# Patient Record
Sex: Female | Born: 1970
Health system: Southern US, Community
[De-identification: ages and names within clinical notes are randomized; demographics above are authoritative.]

## PROBLEM LIST (undated history)

## (undated) DIAGNOSIS — D219 Benign neoplasm of connective and other soft tissue, unspecified: Secondary | ICD-10-CM

## (undated) DIAGNOSIS — T7840XA Allergy, unspecified, initial encounter: Secondary | ICD-10-CM

## (undated) DIAGNOSIS — M549 Dorsalgia, unspecified: Secondary | ICD-10-CM

## (undated) DIAGNOSIS — E669 Obesity, unspecified: Secondary | ICD-10-CM

## (undated) DIAGNOSIS — K219 Gastro-esophageal reflux disease without esophagitis: Secondary | ICD-10-CM

## (undated) HISTORY — PX: UPPER GI ENDOSCOPY: SHX6162

---

## 2001-07-26 ENCOUNTER — Emergency Department (HOSPITAL_COMMUNITY): Admission: EM | Admit: 2001-07-26 | Discharge: 2001-07-26 | Payer: Self-pay

## 2001-11-21 ENCOUNTER — Emergency Department (HOSPITAL_COMMUNITY): Admission: EM | Admit: 2001-11-21 | Discharge: 2001-11-21 | Payer: Self-pay | Admitting: Emergency Medicine

## 2003-02-11 ENCOUNTER — Emergency Department (HOSPITAL_COMMUNITY): Admission: EM | Admit: 2003-02-11 | Discharge: 2003-02-11 | Payer: Self-pay | Admitting: Emergency Medicine

## 2005-09-29 ENCOUNTER — Other Ambulatory Visit: Admission: RE | Admit: 2005-09-29 | Discharge: 2005-09-29 | Payer: Self-pay | Admitting: Family Medicine

## 2006-10-02 ENCOUNTER — Other Ambulatory Visit: Admission: RE | Admit: 2006-10-02 | Discharge: 2006-10-02 | Payer: Self-pay | Admitting: *Deleted

## 2007-11-07 ENCOUNTER — Ambulatory Visit: Payer: Self-pay | Admitting: Gastroenterology

## 2007-12-25 ENCOUNTER — Ambulatory Visit: Payer: Self-pay | Admitting: Obstetrics and Gynecology

## 2008-05-16 ENCOUNTER — Ambulatory Visit: Payer: Self-pay | Admitting: Gastroenterology

## 2009-04-27 ENCOUNTER — Ambulatory Visit: Payer: Self-pay | Admitting: Family Medicine

## 2009-05-12 ENCOUNTER — Ambulatory Visit: Payer: Self-pay | Admitting: Gastroenterology

## 2009-05-20 ENCOUNTER — Ambulatory Visit: Payer: Self-pay | Admitting: Gastroenterology

## 2009-06-07 ENCOUNTER — Ambulatory Visit: Payer: Self-pay | Admitting: Obstetrics and Gynecology

## 2010-07-21 ENCOUNTER — Ambulatory Visit (INDEPENDENT_AMBULATORY_CARE_PROVIDER_SITE_OTHER): Payer: BC Managed Care – PPO | Admitting: Psychology

## 2010-07-21 DIAGNOSIS — F331 Major depressive disorder, recurrent, moderate: Secondary | ICD-10-CM

## 2010-08-25 ENCOUNTER — Ambulatory Visit: Payer: BC Managed Care – PPO | Admitting: Psychology

## 2010-09-01 ENCOUNTER — Ambulatory Visit (INDEPENDENT_AMBULATORY_CARE_PROVIDER_SITE_OTHER): Payer: BC Managed Care – PPO | Admitting: Psychology

## 2010-09-01 DIAGNOSIS — F331 Major depressive disorder, recurrent, moderate: Secondary | ICD-10-CM

## 2010-09-29 ENCOUNTER — Ambulatory Visit: Payer: BC Managed Care – PPO | Admitting: Psychology

## 2010-10-05 ENCOUNTER — Ambulatory Visit (INDEPENDENT_AMBULATORY_CARE_PROVIDER_SITE_OTHER): Payer: BC Managed Care – PPO | Admitting: Psychology

## 2010-10-05 DIAGNOSIS — F4323 Adjustment disorder with mixed anxiety and depressed mood: Secondary | ICD-10-CM

## 2010-10-20 ENCOUNTER — Ambulatory Visit (INDEPENDENT_AMBULATORY_CARE_PROVIDER_SITE_OTHER): Payer: BC Managed Care – PPO | Admitting: Psychology

## 2010-10-20 DIAGNOSIS — F4323 Adjustment disorder with mixed anxiety and depressed mood: Secondary | ICD-10-CM

## 2010-11-02 ENCOUNTER — Ambulatory Visit: Payer: BC Managed Care – PPO | Admitting: Psychology

## 2011-02-02 ENCOUNTER — Ambulatory Visit (INDEPENDENT_AMBULATORY_CARE_PROVIDER_SITE_OTHER): Payer: BC Managed Care – PPO | Admitting: Psychology

## 2011-02-02 DIAGNOSIS — F4323 Adjustment disorder with mixed anxiety and depressed mood: Secondary | ICD-10-CM

## 2011-02-08 ENCOUNTER — Ambulatory Visit: Payer: BC Managed Care – PPO | Admitting: Psychology

## 2011-03-30 ENCOUNTER — Ambulatory Visit: Payer: BC Managed Care – PPO | Admitting: Psychology

## 2011-04-06 ENCOUNTER — Ambulatory Visit (INDEPENDENT_AMBULATORY_CARE_PROVIDER_SITE_OTHER): Payer: BC Managed Care – PPO | Admitting: Psychology

## 2011-04-06 DIAGNOSIS — F4323 Adjustment disorder with mixed anxiety and depressed mood: Secondary | ICD-10-CM

## 2011-04-20 ENCOUNTER — Ambulatory Visit (INDEPENDENT_AMBULATORY_CARE_PROVIDER_SITE_OTHER): Payer: 59 | Admitting: Psychology

## 2011-04-20 DIAGNOSIS — F4323 Adjustment disorder with mixed anxiety and depressed mood: Secondary | ICD-10-CM

## 2011-04-28 ENCOUNTER — Ambulatory Visit: Payer: Self-pay | Admitting: Obstetrics and Gynecology

## 2011-05-11 ENCOUNTER — Ambulatory Visit (INDEPENDENT_AMBULATORY_CARE_PROVIDER_SITE_OTHER): Payer: 59 | Admitting: Psychology

## 2011-05-11 DIAGNOSIS — F4323 Adjustment disorder with mixed anxiety and depressed mood: Secondary | ICD-10-CM

## 2011-06-01 ENCOUNTER — Ambulatory Visit (INDEPENDENT_AMBULATORY_CARE_PROVIDER_SITE_OTHER): Payer: 59 | Admitting: Psychology

## 2011-06-01 DIAGNOSIS — F4323 Adjustment disorder with mixed anxiety and depressed mood: Secondary | ICD-10-CM

## 2011-06-15 ENCOUNTER — Ambulatory Visit: Payer: 59 | Admitting: Psychology

## 2011-06-22 ENCOUNTER — Ambulatory Visit (INDEPENDENT_AMBULATORY_CARE_PROVIDER_SITE_OTHER): Payer: 59 | Admitting: Psychology

## 2011-06-22 DIAGNOSIS — F4323 Adjustment disorder with mixed anxiety and depressed mood: Secondary | ICD-10-CM

## 2011-07-19 ENCOUNTER — Ambulatory Visit (INDEPENDENT_AMBULATORY_CARE_PROVIDER_SITE_OTHER): Payer: 59 | Admitting: Psychology

## 2011-07-19 DIAGNOSIS — F4323 Adjustment disorder with mixed anxiety and depressed mood: Secondary | ICD-10-CM

## 2011-08-23 ENCOUNTER — Ambulatory Visit (INDEPENDENT_AMBULATORY_CARE_PROVIDER_SITE_OTHER): Payer: 59 | Admitting: Psychology

## 2011-08-23 DIAGNOSIS — F4323 Adjustment disorder with mixed anxiety and depressed mood: Secondary | ICD-10-CM

## 2011-09-13 ENCOUNTER — Ambulatory Visit (INDEPENDENT_AMBULATORY_CARE_PROVIDER_SITE_OTHER): Payer: 59 | Admitting: Psychology

## 2011-09-13 DIAGNOSIS — F4323 Adjustment disorder with mixed anxiety and depressed mood: Secondary | ICD-10-CM

## 2011-10-11 ENCOUNTER — Ambulatory Visit (INDEPENDENT_AMBULATORY_CARE_PROVIDER_SITE_OTHER): Payer: 59 | Admitting: Psychology

## 2011-10-11 DIAGNOSIS — F4323 Adjustment disorder with mixed anxiety and depressed mood: Secondary | ICD-10-CM

## 2011-11-02 ENCOUNTER — Ambulatory Visit: Payer: 59 | Admitting: Psychology

## 2012-01-10 ENCOUNTER — Ambulatory Visit: Payer: Self-pay | Admitting: Family Medicine

## 2012-07-18 ENCOUNTER — Ambulatory Visit: Payer: Self-pay | Admitting: Obstetrics and Gynecology

## 2012-08-15 ENCOUNTER — Ambulatory Visit: Payer: 59 | Admitting: Psychology

## 2013-04-09 ENCOUNTER — Ambulatory Visit: Payer: 59 | Admitting: Psychology

## 2013-04-10 ENCOUNTER — Ambulatory Visit: Payer: 59 | Admitting: Psychology

## 2014-05-13 ENCOUNTER — Ambulatory Visit: Payer: Self-pay | Admitting: Family Medicine

## 2015-05-20 ENCOUNTER — Other Ambulatory Visit: Payer: Self-pay | Admitting: Family Medicine

## 2015-05-20 DIAGNOSIS — Z1231 Encounter for screening mammogram for malignant neoplasm of breast: Secondary | ICD-10-CM

## 2015-05-28 ENCOUNTER — Ambulatory Visit: Payer: 59

## 2015-06-04 ENCOUNTER — Ambulatory Visit
Admission: RE | Admit: 2015-06-04 | Discharge: 2015-06-04 | Disposition: A | Discharge status: Home / Self Care | Payer: 59 | Source: Ambulatory Visit | Attending: Family Medicine | Admitting: Family Medicine

## 2015-06-04 DIAGNOSIS — Z1231 Encounter for screening mammogram for malignant neoplasm of breast: Secondary | ICD-10-CM | POA: Diagnosis not present

## 2015-07-13 ENCOUNTER — Encounter: Payer: Self-pay | Admitting: *Deleted

## 2015-07-14 ENCOUNTER — Ambulatory Visit: Payer: 59 | Admitting: Anesthesiology

## 2015-07-14 ENCOUNTER — Ambulatory Visit
Admission: RE | Admit: 2015-07-14 | Discharge: 2015-07-14 | Disposition: A | Discharge status: Home / Self Care | Payer: 59 | Source: Ambulatory Visit | Attending: Gastroenterology | Admitting: Gastroenterology

## 2015-07-14 ENCOUNTER — Encounter
Admission: RE | Disposition: A | Discharge status: Home / Self Care | Payer: Self-pay | Source: Ambulatory Visit | Attending: Gastroenterology

## 2015-07-14 DIAGNOSIS — Z79899 Other long term (current) drug therapy: Secondary | ICD-10-CM | POA: Insufficient documentation

## 2015-07-14 DIAGNOSIS — R197 Diarrhea, unspecified: Secondary | ICD-10-CM | POA: Diagnosis present

## 2015-07-14 DIAGNOSIS — K219 Gastro-esophageal reflux disease without esophagitis: Secondary | ICD-10-CM | POA: Diagnosis not present

## 2015-07-14 DIAGNOSIS — Z6838 Body mass index (BMI) 38.0-38.9, adult: Secondary | ICD-10-CM | POA: Insufficient documentation

## 2015-07-14 DIAGNOSIS — D125 Benign neoplasm of sigmoid colon: Secondary | ICD-10-CM | POA: Insufficient documentation

## 2015-07-14 HISTORY — DX: Obesity, unspecified: E66.9

## 2015-07-14 HISTORY — DX: Gastro-esophageal reflux disease without esophagitis: K21.9

## 2015-07-14 HISTORY — DX: Benign neoplasm of connective and other soft tissue, unspecified: D21.9

## 2015-07-14 HISTORY — DX: Dorsalgia, unspecified: M54.9

## 2015-07-14 HISTORY — DX: Allergy, unspecified, initial encounter: T78.40XA

## 2015-07-14 HISTORY — PX: COLONOSCOPY WITH PROPOFOL: SHX5780

## 2015-07-14 SURGERY — COLONOSCOPY WITH PROPOFOL
Anesthesia: General

## 2015-07-14 MED ORDER — SODIUM CHLORIDE 0.9 % IV SOLN
INTRAVENOUS | Status: DC
  Administered 2015-07-14: 1000 mL via INTRAVENOUS

## 2015-07-14 MED ORDER — SODIUM CHLORIDE 0.9 % IV SOLN: INTRAVENOUS | Status: DC

## 2015-07-14 MED ORDER — PROPOFOL 500 MG/50ML IV EMUL
INTRAVENOUS | Status: DC | PRN
  Administered 2015-07-14: 200 ug/kg/min via INTRAVENOUS

## 2015-07-14 MED ORDER — MIDAZOLAM HCL 5 MG/5ML IJ SOLN
INTRAMUSCULAR | Status: DC | PRN
  Administered 2015-07-14: 1 mg via INTRAVENOUS

## 2015-07-14 MED ORDER — FENTANYL CITRATE (PF) 100 MCG/2ML IJ SOLN
INTRAMUSCULAR | Status: DC | PRN
  Administered 2015-07-14: 50 ug via INTRAVENOUS

## 2015-07-14 MED ORDER — LIDOCAINE HCL (CARDIAC) 20 MG/ML IV SOLN
INTRAVENOUS | Status: DC | PRN
  Administered 2015-07-14: 30 mg via INTRAVENOUS

## 2015-07-14 MED ORDER — LABETALOL HCL 5 MG/ML IV SOLN
INTRAVENOUS | Status: DC | PRN
  Administered 2015-07-14 (×2): 10 mg via INTRAVENOUS

## 2015-07-14 MED ORDER — PROPOFOL 10 MG/ML IV BOLUS
INTRAVENOUS | Status: DC | PRN
  Administered 2015-07-14 (×2): 50 mg via INTRAVENOUS

## 2015-07-14 NOTE — Op Note (Signed)
[facility] Patient Name: Stephanie Howell Procedure Date: 07/14/2015 2:12 PM MRN: [id number] Account #: [id number] Date of Birth: [birth year] Admit Type: Outpatient Age: 45 Room: [facility] ENDO ROOM 3 Gender: Female Note Status: Finalized Procedure:            Colonoscopy Indications:          Clinically significant diarrhea of unexplained origin,                        Change in bowel habits Providers:            Lollie Sails, MD Referring MD:         Irven Easterly. Kary Kos, MD (Referring MD) Medicines:            Monitored Anesthesia Care Complications:        No immediate complications. Procedure:            Pre-Anesthesia Assessment:                       - ASA Grade Assessment: II - A patient with mild                        systemic disease.                       After obtaining informed consent, the colonoscope was                        passed under direct vision. Throughout the procedure,                        the patient's blood pressure, pulse, and oxygen                        saturations were monitored continuously. The                        Colonoscope was introduced through the anus and                        advanced to the the cecum, identified by appendiceal                        orifice and ileocecal valve. The colonoscopy was                        performed with moderate difficulty due to a tortuous                        colon. Successful completion of the procedure was aided                        by changing the patient to a supine position, changing                        the patient to a prone position and using manual                        pressure. The patient tolerated the procedure well. The  quality of the bowel preparation was good. Findings:      A 3 mm polyp was found in the proximal sigmoid colon. The polyp was       sessile. The polyp was removed with a cold biopsy forceps. Resection and    retrieval were complete.      Biopsies for histology were taken with a cold forceps from the right       colon and left colon for evaluation of microscopic colitis.      The sigmoid colon, descending colon and transverse colon were moderately       tortuous.      The digital rectal exam was normal. Impression:           - One 3 mm polyp in the proximal sigmoid colon, removed                        with a cold biopsy forceps. Resected and retrieved.                       - Tortuous colon.                       - Biopsies were taken with a cold forceps from the                        right colon and left colon for evaluation of                        microscopic colitis. Recommendation:       - Discharge patient to home.                       - Await pathology results.                       - Telephone GI clinic for pathology results in 1 week.                       - Return to GI clinic in 3 weeks. Procedure Code(s):    --- Professional ---                       [phone], Colonoscopy, flexible; with biopsy, single or                        multiple Diagnosis Code(s):    --- Professional ---                       D12.5, Benign neoplasm of sigmoid colon                       R19.7, Diarrhea, unspecified                       R19.4, Change in bowel habit                       Q43.8, Other specified congenital malformations of                        intestine CPT copyright 2016 American Medical Association. All rights reserved. The codes documented in this report are preliminary  and upon coder review may  be revised to meet current compliance requirements. Lollie Sails, MD 07/14/2015 3:04:28 PM This report has been signed electronically. Number of Addenda: 0 Note Initiated On: 07/14/2015 2:12 PM Scope Withdrawal Time: 0 hours 17 minutes 8 seconds  Total Procedure Duration: 0 hours 32 minutes 46 seconds       [facility]

## 2015-07-14 NOTE — Transfer of Care (Signed)
Immediate Anesthesia Transfer of Care Note  Patient: Stephanie Howell  Procedure(s) Performed: Procedure(s): COLONOSCOPY WITH PROPOFOL (N/A)  Patient Location: PACU and Endoscopy Unit  Anesthesia Type:General  Level of Consciousness: awake and oriented  Airway & Oxygen Therapy: Patient Spontanous Breathing and Patient connected to nasal cannula oxygen  Post-op Assessment: Report given to RN and Post -op Vital signs reviewed and stable  Post vital signs: Reviewed and stable  Last Vitals:  Filed Vitals:   07/14/15 1308 07/14/15 1500  BP: 152/103 134/91  Pulse: 79 83  Temp: 36.1 C 36 C  Resp: 19 16    Last Pain: There were no vitals filed for this visit.       Complications: No apparent anesthesia complications

## 2015-07-14 NOTE — H&P (Signed)
Outpatient short stay form Pre-procedure 07/14/2015 2:00 PM Lollie Sails MD  Primary Physician: Dr. Maryland Pink  Reason for visit:  Colonoscopy  History of present illness:  Patient is a 45 year old female presenting today for colonoscopy with a change of bowel habits/irregular bowel habits. She states that she has been having episodic events of uncontrollable diarrhea about every 1-2 months it may last for several days. He had a similar episode of these occurring about 2 years ago. She does have lower abdominal pain or discomfort with this. She has not seen any black or bloody stools. She does occasionally take Imodium.  She tolerated her prep well. She denies use of any aspirin or blood thinning agents. In further discussion with patient's this sounds much like irritable bowel syndrome. There are symptoms of lower abdominal bloating as well as incomplete bowel movements.     Current facility-administered medications:  .  0.9 %  sodium chloride infusion, , Intravenous, Continuous, Lollie Sails, MD, Last Rate: 20 mL/hr at 07/14/15 1330, 1,000 mL at 07/14/15 1330 .  0.9 %  sodium chloride infusion, , Intravenous, Continuous, Lollie Sails, MD  Prescriptions prior to admission  Medication Sig Dispense Refill Last Dose  . Biotin 1 MG CAPS Take 1 mg by mouth daily.     . Cholecalciferol 1000 units TBDP Take 1,000 Units by mouth daily.     . meloxicam (MOBIC) 15 MG tablet Take 15 mg by mouth daily.     . Multiple Vitamin (MULTIVITAMIN) tablet Take 1 tablet by mouth daily.     Donnetta Hail Estradiol (SPRINTEC 28 PO) Take by mouth.   07/14/2015 at 0800  . omeprazole-sodium bicarbonate (ZEGERID) 40-1100 MG capsule Take 1 capsule by mouth daily before breakfast.        No Known Allergies   Past Medical History  Diagnosis Date  . Allergic state   . Back pain   . Fibroids   . Obesity   . GERD (gastroesophageal reflux disease)     Review of systems:      Physical  Exam    Heart and lungs: Regular rate and rhythm without rub or gallop, lungs are bilaterally clear.    HEENT: Normocephalic atraumatic eyes are anicteric    Other:     Pertinant exam for procedure: Soft nontender nondistended bowel sounds positive normoactive. There is some lower abdominal discomfort to light palpation. She has no rebound.    Planned proceedures: Colonoscopy and indicated procedures. I have discussed the risks benefits and complications of procedures to include not limited to bleeding, infection, perforation and the risk of sedation and the patient wishes to proceed.    Lollie Sails, MD Gastroenterology 07/14/2015  2:00 PM

## 2015-07-14 NOTE — Anesthesia Preprocedure Evaluation (Signed)
Anesthesia Evaluation  Patient identified by MRN, date of birth, ID band Patient awake    Reviewed: Allergy & Precautions, H&P , NPO status , Patient's Chart, lab work & pertinent test results, reviewed documented beta blocker date and time   History of Anesthesia Complications Negative for: history of anesthetic complications  Airway Mallampati: III  TM Distance: >3 FB Neck ROM: full    Dental no notable dental hx. (+) Teeth Intact   Pulmonary neg pulmonary ROS,    Pulmonary exam normal breath sounds clear to auscultation       Cardiovascular Exercise Tolerance: Good negative cardio ROS Normal cardiovascular exam Rhythm:regular Rate:Normal     Neuro/Psych negative neurological ROS  negative psych ROS   GI/Hepatic Neg liver ROS, GERD  ,  Endo/Other  negative endocrine ROS  Renal/GU negative Renal ROS  negative genitourinary   Musculoskeletal   Abdominal   Peds  Hematology negative hematology ROS (+)   Anesthesia Other Findings Past Medical History:   Allergic state                                               Back pain                                                    Fibroids                                                     Obesity                                                      GERD (gastroesophageal reflux disease)                       Reproductive/Obstetrics negative OB ROS                             Anesthesia Physical Anesthesia Plan  ASA: II  Anesthesia Plan: General   Post-op Pain Management:    Induction:   Airway Management Planned:   Additional Equipment:   Intra-op Plan:   Post-operative Plan:   Informed Consent: I have reviewed the patients History and Physical, chart, labs and discussed the procedure including the risks, benefits and alternatives for the proposed anesthesia with the patient or authorized representative who has indicated his/her  understanding and acceptance.   Dental Advisory Given  Plan Discussed with: Anesthesiologist, CRNA and Surgeon  Anesthesia Plan Comments:         Anesthesia Quick Evaluation

## 2015-07-14 NOTE — Anesthesia Postprocedure Evaluation (Signed)
Anesthesia Post Note  Patient: Stephanie Howell  Procedure(s) Performed: Procedure(s) (LRB): COLONOSCOPY WITH PROPOFOL (N/A)  Patient location during evaluation: Endoscopy Anesthesia Type: General Level of consciousness: awake and alert Pain management: pain level controlled Vital Signs Assessment: post-procedure vital signs reviewed and stable Respiratory status: spontaneous breathing and respiratory function stable Cardiovascular status: stable Anesthetic complications: no    Last Vitals:  Filed Vitals:   07/14/15 1503 07/14/15 1510  BP: 134/91 137/89  Pulse: 80 76  Temp:    Resp: 20 15    Last Pain: There were no vitals filed for this visit.               Najmo Pardue K

## 2015-07-14 NOTE — OR Nursing (Signed)
12:20 -  Dr. Marcello Moores notified of increased diastolic pressure.  No new orders received.  Norlene Campbell, RN

## 2015-07-16 LAB — SURGICAL PATHOLOGY

## 2015-07-18 ENCOUNTER — Encounter: Payer: Self-pay | Admitting: Gastroenterology

## 2016-03-15 DIAGNOSIS — I10 Essential (primary) hypertension: Secondary | ICD-10-CM | POA: Diagnosis not present

## 2016-03-15 DIAGNOSIS — Z8249 Family history of ischemic heart disease and other diseases of the circulatory system: Secondary | ICD-10-CM | POA: Diagnosis not present

## 2016-04-29 ENCOUNTER — Other Ambulatory Visit: Payer: Self-pay | Admitting: Family Medicine

## 2016-04-29 DIAGNOSIS — Z1231 Encounter for screening mammogram for malignant neoplasm of breast: Secondary | ICD-10-CM

## 2016-05-10 DIAGNOSIS — N951 Menopausal and female climacteric states: Secondary | ICD-10-CM | POA: Diagnosis not present

## 2016-05-10 DIAGNOSIS — Z0000 Encounter for general adult medical examination without abnormal findings: Secondary | ICD-10-CM | POA: Diagnosis not present

## 2016-05-23 DIAGNOSIS — Z124 Encounter for screening for malignant neoplasm of cervix: Secondary | ICD-10-CM | POA: Diagnosis not present

## 2016-05-23 DIAGNOSIS — Z0000 Encounter for general adult medical examination without abnormal findings: Secondary | ICD-10-CM | POA: Diagnosis not present

## 2016-05-23 DIAGNOSIS — N951 Menopausal and female climacteric states: Secondary | ICD-10-CM | POA: Diagnosis not present

## 2016-06-24 ENCOUNTER — Ambulatory Visit: Payer: 59

## 2016-06-29 ENCOUNTER — Ambulatory Visit
Admission: RE | Admit: 2016-06-29 | Discharge: 2016-06-29 | Disposition: A | Discharge status: Home / Self Care | Payer: 59 | Source: Ambulatory Visit | Attending: Family Medicine | Admitting: Family Medicine

## 2016-06-29 DIAGNOSIS — Z1231 Encounter for screening mammogram for malignant neoplasm of breast: Secondary | ICD-10-CM

## 2016-08-29 DIAGNOSIS — R300 Dysuria: Secondary | ICD-10-CM | POA: Diagnosis not present

## 2017-01-17 DIAGNOSIS — R102 Pelvic and perineal pain: Secondary | ICD-10-CM | POA: Diagnosis not present

## 2017-01-17 DIAGNOSIS — N951 Menopausal and female climacteric states: Secondary | ICD-10-CM | POA: Diagnosis not present

## 2017-02-15 DIAGNOSIS — R102 Pelvic and perineal pain: Secondary | ICD-10-CM | POA: Diagnosis not present

## 2017-02-21 DIAGNOSIS — J069 Acute upper respiratory infection, unspecified: Secondary | ICD-10-CM | POA: Diagnosis not present

## 2017-02-21 DIAGNOSIS — Z23 Encounter for immunization: Secondary | ICD-10-CM | POA: Diagnosis not present

## 2017-03-06 DIAGNOSIS — J40 Bronchitis, not specified as acute or chronic: Secondary | ICD-10-CM | POA: Diagnosis not present

## 2017-03-06 DIAGNOSIS — R0981 Nasal congestion: Secondary | ICD-10-CM | POA: Diagnosis not present

## 2017-03-16 DIAGNOSIS — R102 Pelvic and perineal pain: Secondary | ICD-10-CM | POA: Diagnosis not present

## 2017-03-22 DIAGNOSIS — J069 Acute upper respiratory infection, unspecified: Secondary | ICD-10-CM | POA: Diagnosis not present

## 2017-04-21 DIAGNOSIS — R509 Fever, unspecified: Secondary | ICD-10-CM | POA: Diagnosis not present

## 2017-04-21 DIAGNOSIS — J029 Acute pharyngitis, unspecified: Secondary | ICD-10-CM | POA: Diagnosis not present

## 2017-04-21 DIAGNOSIS — J069 Acute upper respiratory infection, unspecified: Secondary | ICD-10-CM | POA: Diagnosis not present

## 2017-05-25 DIAGNOSIS — N3281 Overactive bladder: Secondary | ICD-10-CM | POA: Diagnosis not present

## 2017-05-25 DIAGNOSIS — Z01411 Encounter for gynecological examination (general) (routine) with abnormal findings: Secondary | ICD-10-CM | POA: Diagnosis not present

## 2017-05-25 DIAGNOSIS — Z124 Encounter for screening for malignant neoplasm of cervix: Secondary | ICD-10-CM | POA: Diagnosis not present

## 2017-05-31 DIAGNOSIS — Z1211 Encounter for screening for malignant neoplasm of colon: Secondary | ICD-10-CM | POA: Diagnosis not present

## 2017-06-02 ENCOUNTER — Other Ambulatory Visit: Payer: Self-pay | Admitting: Family Medicine

## 2017-06-02 DIAGNOSIS — Z1231 Encounter for screening mammogram for malignant neoplasm of breast: Secondary | ICD-10-CM

## 2017-07-25 ENCOUNTER — Ambulatory Visit
Admission: RE | Admit: 2017-07-25 | Discharge: 2017-07-25 | Disposition: A | Discharge status: Home / Self Care | Payer: 59 | Source: Ambulatory Visit | Attending: Family Medicine | Admitting: Family Medicine

## 2017-07-25 DIAGNOSIS — Z1231 Encounter for screening mammogram for malignant neoplasm of breast: Secondary | ICD-10-CM

## 2017-08-22 DIAGNOSIS — Z0000 Encounter for general adult medical examination without abnormal findings: Secondary | ICD-10-CM | POA: Diagnosis not present

## 2017-08-24 DIAGNOSIS — D72829 Elevated white blood cell count, unspecified: Secondary | ICD-10-CM | POA: Diagnosis not present

## 2017-08-24 DIAGNOSIS — Z0000 Encounter for general adult medical examination without abnormal findings: Secondary | ICD-10-CM | POA: Diagnosis not present

## 2017-08-24 DIAGNOSIS — J019 Acute sinusitis, unspecified: Secondary | ICD-10-CM | POA: Diagnosis not present

## 2017-09-19 DIAGNOSIS — K529 Noninfective gastroenteritis and colitis, unspecified: Secondary | ICD-10-CM | POA: Diagnosis not present

## 2017-09-19 DIAGNOSIS — R1084 Generalized abdominal pain: Secondary | ICD-10-CM | POA: Diagnosis not present

## 2017-09-19 DIAGNOSIS — R110 Nausea: Secondary | ICD-10-CM | POA: Diagnosis not present

## 2017-09-20 DIAGNOSIS — R110 Nausea: Secondary | ICD-10-CM | POA: Diagnosis not present

## 2017-09-20 DIAGNOSIS — Z789 Other specified health status: Secondary | ICD-10-CM | POA: Diagnosis not present

## 2017-09-20 DIAGNOSIS — K529 Noninfective gastroenteritis and colitis, unspecified: Secondary | ICD-10-CM | POA: Diagnosis not present

## 2017-09-20 DIAGNOSIS — D72829 Elevated white blood cell count, unspecified: Secondary | ICD-10-CM | POA: Diagnosis not present

## 2017-10-17 DIAGNOSIS — D72829 Elevated white blood cell count, unspecified: Secondary | ICD-10-CM | POA: Diagnosis not present

## 2017-10-17 DIAGNOSIS — Z111 Encounter for screening for respiratory tuberculosis: Secondary | ICD-10-CM | POA: Diagnosis not present

## 2017-12-13 DIAGNOSIS — K120 Recurrent oral aphthae: Secondary | ICD-10-CM | POA: Diagnosis not present

## 2017-12-13 DIAGNOSIS — L249 Irritant contact dermatitis, unspecified cause: Secondary | ICD-10-CM | POA: Diagnosis not present

## 2018-01-17 DIAGNOSIS — I10 Essential (primary) hypertension: Secondary | ICD-10-CM | POA: Diagnosis not present

## 2018-01-17 DIAGNOSIS — L819 Disorder of pigmentation, unspecified: Secondary | ICD-10-CM | POA: Diagnosis not present

## 2018-01-17 DIAGNOSIS — R0981 Nasal congestion: Secondary | ICD-10-CM | POA: Diagnosis not present

## 2018-02-16 DIAGNOSIS — K146 Glossodynia: Secondary | ICD-10-CM | POA: Diagnosis not present

## 2018-02-16 DIAGNOSIS — J069 Acute upper respiratory infection, unspecified: Secondary | ICD-10-CM | POA: Diagnosis not present

## 2018-03-16 DIAGNOSIS — Z711 Person with feared health complaint in whom no diagnosis is made: Secondary | ICD-10-CM | POA: Diagnosis not present

## 2018-03-16 DIAGNOSIS — L821 Other seborrheic keratosis: Secondary | ICD-10-CM | POA: Diagnosis not present

## 2018-03-30 DIAGNOSIS — N760 Acute vaginitis: Secondary | ICD-10-CM | POA: Diagnosis not present

## 2018-03-30 DIAGNOSIS — N898 Other specified noninflammatory disorders of vagina: Secondary | ICD-10-CM | POA: Diagnosis not present

## 2018-03-30 DIAGNOSIS — B9689 Other specified bacterial agents as the cause of diseases classified elsewhere: Secondary | ICD-10-CM | POA: Diagnosis not present

## 2018-05-12 DIAGNOSIS — K146 Glossodynia: Secondary | ICD-10-CM | POA: Diagnosis not present

## 2018-05-12 DIAGNOSIS — N898 Other specified noninflammatory disorders of vagina: Secondary | ICD-10-CM | POA: Diagnosis not present

## 2018-05-22 DIAGNOSIS — R1013 Epigastric pain: Secondary | ICD-10-CM | POA: Diagnosis not present

## 2018-05-22 DIAGNOSIS — R1011 Right upper quadrant pain: Secondary | ICD-10-CM | POA: Diagnosis not present

## 2018-06-19 DIAGNOSIS — K146 Glossodynia: Secondary | ICD-10-CM | POA: Diagnosis not present

## 2018-06-19 DIAGNOSIS — K529 Noninfective gastroenteritis and colitis, unspecified: Secondary | ICD-10-CM | POA: Diagnosis not present

## 2018-07-17 ENCOUNTER — Other Ambulatory Visit: Payer: Self-pay | Admitting: Family Medicine

## 2018-07-17 DIAGNOSIS — Z1231 Encounter for screening mammogram for malignant neoplasm of breast: Secondary | ICD-10-CM

## 2018-12-11 ENCOUNTER — Other Ambulatory Visit: Payer: Self-pay

## 2018-12-11 DIAGNOSIS — Z20822 Contact with and (suspected) exposure to covid-19: Secondary | ICD-10-CM

## 2018-12-13 LAB — NOVEL CORONAVIRUS, NAA: SARS-CoV-2, NAA: NOT DETECTED

## 2019-05-11 ENCOUNTER — Ambulatory Visit: Payer: Self-pay | Attending: Internal Medicine

## 2019-05-11 DIAGNOSIS — Z23 Encounter for immunization: Secondary | ICD-10-CM | POA: Insufficient documentation

## 2019-05-11 NOTE — Progress Notes (Signed)
   Covid-19 Vaccination Clinic  Name:  Stephanie Howell    MRN: [id number] DOB: March 22, 1970  05/11/2019  [name] was observed post Covid-19 immunization for 15 minutes without incident. She was provided with Vaccine Information Sheet and instruction to access the V-Safe system.   [name] was instructed to call 911 with any severe reactions post vaccine: Marland Kitchen Difficulty breathing  . Swelling of face and throat  . A fast heartbeat  . A bad rash all over body  . Dizziness and weakness   Immunizations Administered    Name Date Dose VIS Date Route   Pfizer COVID-19 Vaccine 05/11/2019  8:27 AM 0.3 mL 02/15/2019 Intramuscular   Manufacturer: [place]   Lot: [id number]   [place]: [id number]

## 2019-06-01 ENCOUNTER — Ambulatory Visit: Payer: Self-pay | Attending: Internal Medicine

## 2019-06-01 DIAGNOSIS — Z23 Encounter for immunization: Secondary | ICD-10-CM

## 2019-06-01 NOTE — Progress Notes (Signed)
   Covid-19 Vaccination Clinic  Name:  Stephanie Howell    MRN: [id number] DOB: 08-29-1970  06/01/2019  [name] was observed post Covid-19 immunization for 15 minutes without incident. She was provided with Vaccine Information Sheet and instruction to access the V-Safe system.   [name] was instructed to call 911 with any severe reactions post vaccine: Marland Kitchen Difficulty breathing  . Swelling of face and throat  . A fast heartbeat  . A bad rash all over body  . Dizziness and weakness   Immunizations Administered    Name Date Dose VIS Date Route   Pfizer COVID-19 Vaccine 06/01/2019  8:36 AM 0.3 mL 02/15/2019 Intramuscular   Manufacturer: [place]   Lot: [id number]   [place]: [id number]

## 2019-07-15 ENCOUNTER — Other Ambulatory Visit: Payer: Self-pay | Admitting: Family Medicine

## 2019-07-15 DIAGNOSIS — Z1231 Encounter for screening mammogram for malignant neoplasm of breast: Secondary | ICD-10-CM

## 2019-12-05 ENCOUNTER — Other Ambulatory Visit: Payer: Self-pay

## 2019-12-05 ENCOUNTER — Ambulatory Visit
Admission: RE | Admit: 2019-12-05 | Discharge: 2019-12-05 | Disposition: A | Discharge status: Home / Self Care | Payer: 59 | Source: Ambulatory Visit | Attending: Family Medicine | Admitting: Family Medicine

## 2019-12-05 DIAGNOSIS — Z1231 Encounter for screening mammogram for malignant neoplasm of breast: Secondary | ICD-10-CM | POA: Diagnosis present

## 2019-12-16 ENCOUNTER — Inpatient Hospital Stay
Admission: RE | Admit: 2019-12-16 | Discharge: 2019-12-16 | Disposition: A | Discharge status: Home / Self Care | Payer: Self-pay | Source: Ambulatory Visit | Attending: *Deleted | Admitting: *Deleted

## 2019-12-16 ENCOUNTER — Other Ambulatory Visit: Payer: Self-pay | Admitting: *Deleted

## 2019-12-16 DIAGNOSIS — Z1231 Encounter for screening mammogram for malignant neoplasm of breast: Secondary | ICD-10-CM

## 2020-05-26 ENCOUNTER — Other Ambulatory Visit: Payer: Self-pay | Admitting: Family Medicine

## 2020-05-26 DIAGNOSIS — Z1231 Encounter for screening mammogram for malignant neoplasm of breast: Secondary | ICD-10-CM

## 2020-12-07 ENCOUNTER — Other Ambulatory Visit: Payer: Self-pay

## 2020-12-07 ENCOUNTER — Ambulatory Visit
Admission: RE | Admit: 2020-12-07 | Discharge: 2020-12-07 | Disposition: A | Discharge status: Home / Self Care | Payer: Commercial Managed Care - PPO | Source: Ambulatory Visit | Attending: Family Medicine | Admitting: Family Medicine

## 2020-12-07 DIAGNOSIS — Z1231 Encounter for screening mammogram for malignant neoplasm of breast: Secondary | ICD-10-CM | POA: Insufficient documentation

## 2021-08-27 ENCOUNTER — Other Ambulatory Visit: Payer: Self-pay | Admitting: Family Medicine

## 2021-08-27 DIAGNOSIS — Z1231 Encounter for screening mammogram for malignant neoplasm of breast: Secondary | ICD-10-CM

## 2021-11-27 IMAGING — MG MM DIGITAL SCREENING BILAT W/ TOMO AND CAD
8 series · 8 of 24 positions shown · non-contrast
Comparison: Previous exam(s).

CLINICAL DATA: Screening.

EXAM:
DIGITAL SCREENING BILATERAL MAMMOGRAM WITH TOMOSYNTHESIS AND CAD
TECHNIQUE: Bilateral screening digital craniocaudal and mediolateral oblique
mammograms were obtained. Bilateral screening digital breast
tomosynthesis was performed. The images were evaluated with
computer-aided detection.

[R MLO synth-2D]
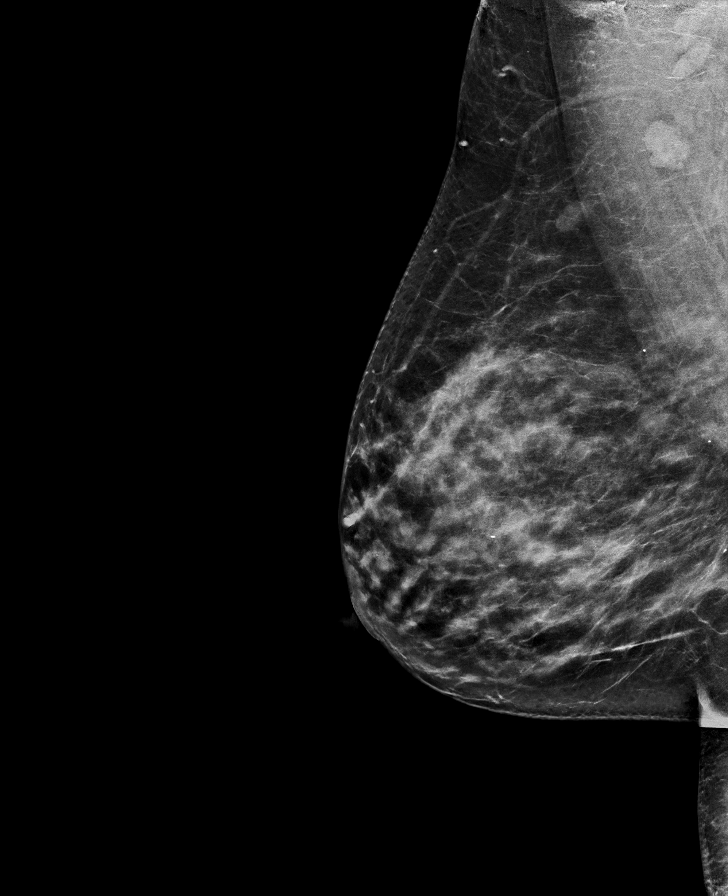

[L CC synth-2D]
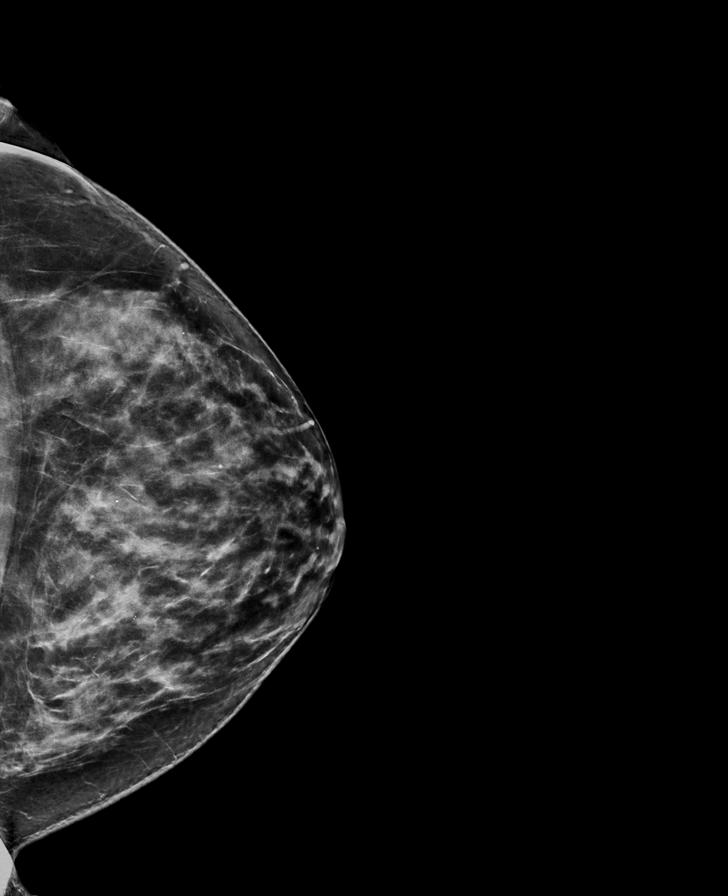

[R CC synth-2D]
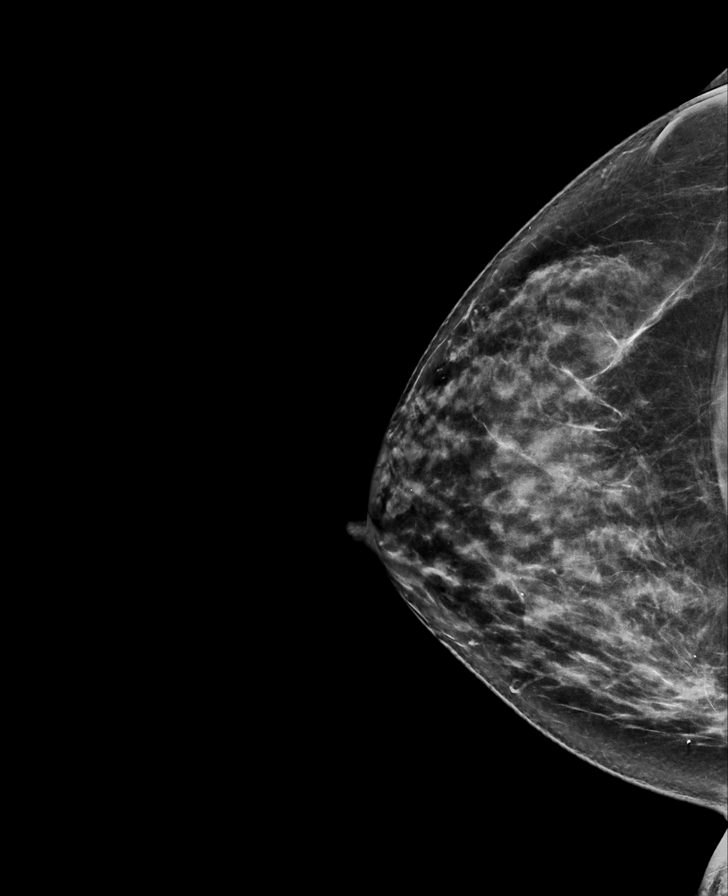

[L MLO synth-2D]
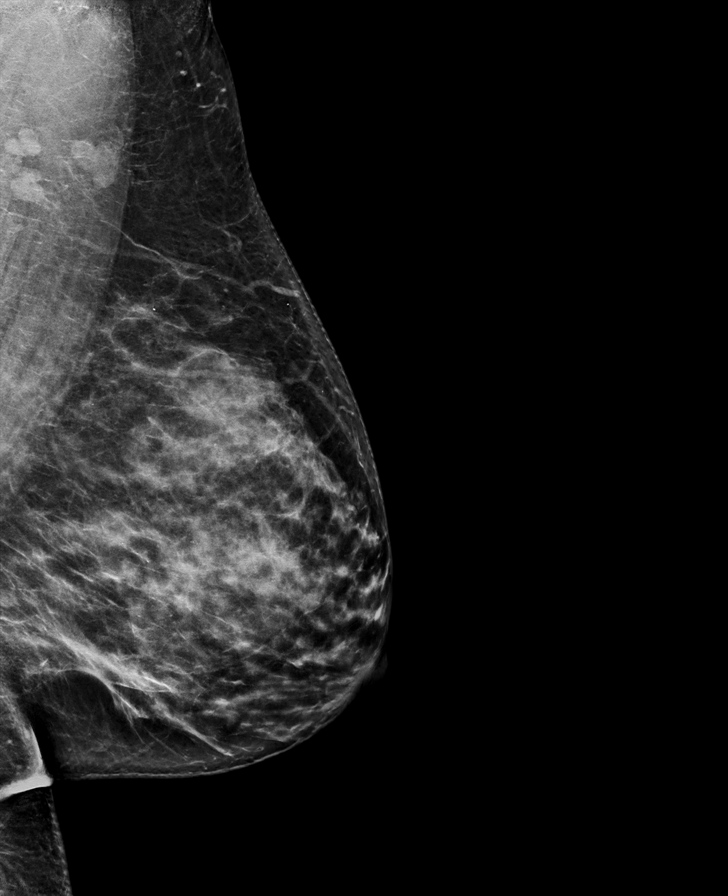

[L MLO tomo · tomo slice 38/75.0]
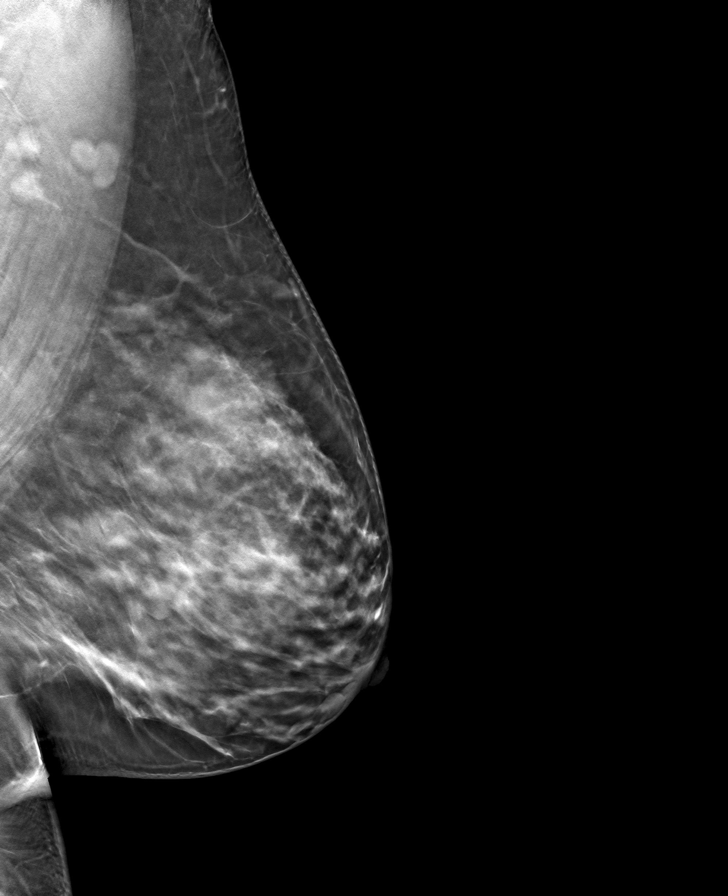

[L CC tomo · tomo slice 37/74.0]
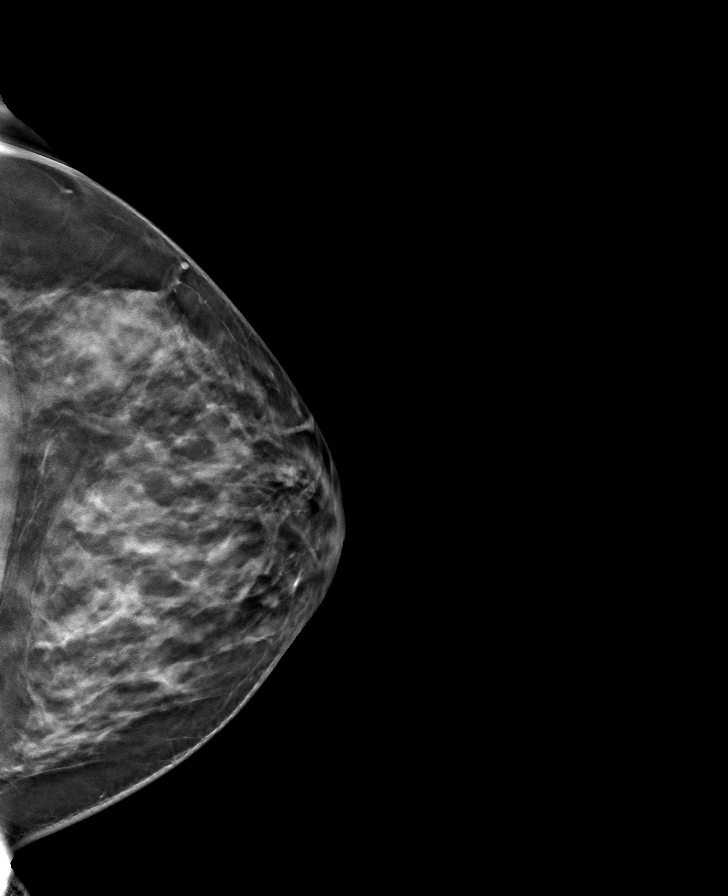

[R CC tomo · tomo slice 36/71.0]
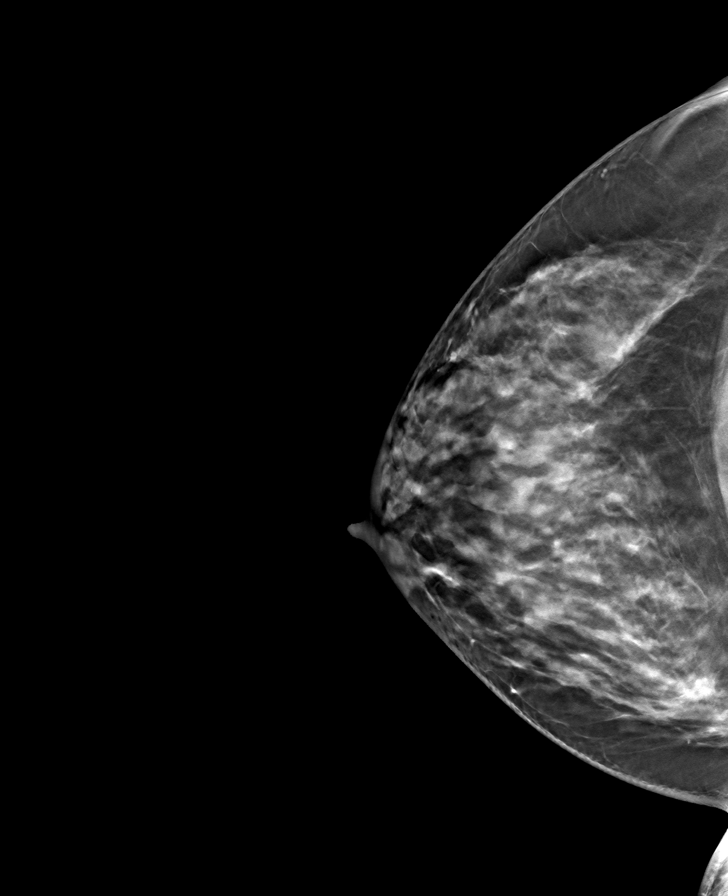

[R MLO tomo · tomo slice 38/75.0]
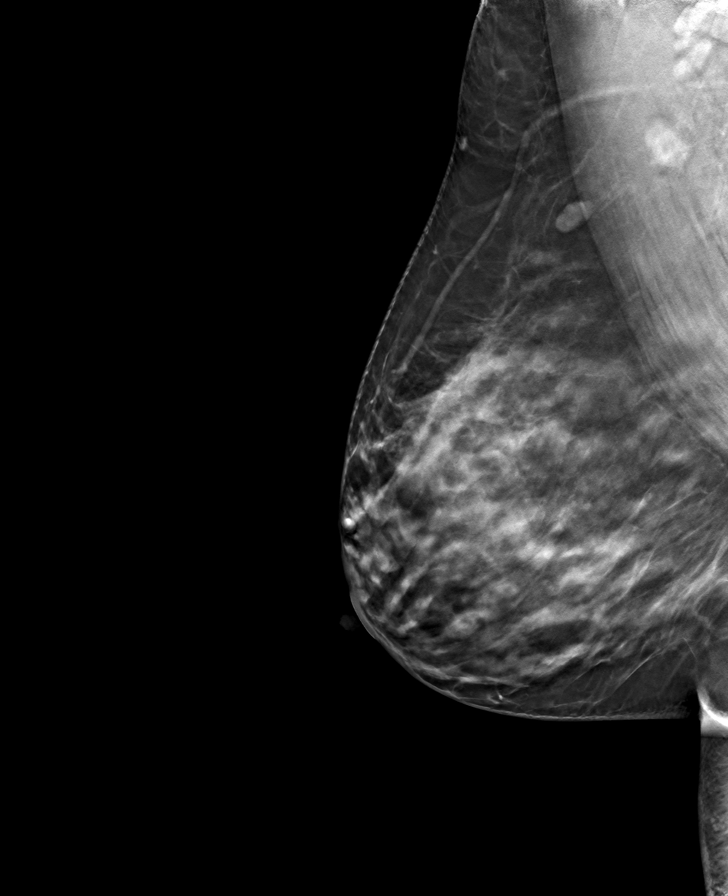

[8 of 24 positions shown; findings below may reference images not displayed]

ACR Breast Density Category c: The breast tissue is heterogeneously
dense, which may obscure small masses.
FINDINGS: There are no findings suspicious for malignancy.
IMPRESSION: No mammographic evidence of malignancy. A result letter of this
screening mammogram will be mailed directly to the patient.

RECOMMENDATION:
Screening mammogram in one year. (Code:[id number]-[id number])

BI-RADS CATEGORY  1: Negative.

## 2022-03-28 ENCOUNTER — Ambulatory Visit
Admission: RE | Admit: 2022-03-28 | Discharge: 2022-03-28 | Disposition: A | Discharge status: Home / Self Care | Payer: BC Managed Care – PPO | Source: Ambulatory Visit | Attending: Family Medicine | Admitting: Family Medicine

## 2022-03-28 DIAGNOSIS — Z1231 Encounter for screening mammogram for malignant neoplasm of breast: Secondary | ICD-10-CM | POA: Diagnosis not present

## 2022-03-31 ENCOUNTER — Other Ambulatory Visit: Payer: Self-pay | Admitting: Family Medicine

## 2022-03-31 DIAGNOSIS — R921 Mammographic calcification found on diagnostic imaging of breast: Secondary | ICD-10-CM

## 2022-03-31 DIAGNOSIS — R928 Other abnormal and inconclusive findings on diagnostic imaging of breast: Secondary | ICD-10-CM

## 2022-04-01 ENCOUNTER — Ambulatory Visit
Admission: RE | Admit: 2022-04-01 | Discharge: 2022-04-01 | Disposition: A | Discharge status: Home / Self Care | Payer: BC Managed Care – PPO | Source: Ambulatory Visit | Attending: Family Medicine | Admitting: Family Medicine

## 2022-04-01 DIAGNOSIS — R928 Other abnormal and inconclusive findings on diagnostic imaging of breast: Secondary | ICD-10-CM | POA: Diagnosis present

## 2022-04-01 DIAGNOSIS — R921 Mammographic calcification found on diagnostic imaging of breast: Secondary | ICD-10-CM | POA: Diagnosis present

## 2022-06-02 ENCOUNTER — Other Ambulatory Visit: Payer: Self-pay | Admitting: Family Medicine

## 2022-06-02 DIAGNOSIS — R921 Mammographic calcification found on diagnostic imaging of breast: Secondary | ICD-10-CM

## 2022-06-30 ENCOUNTER — Ambulatory Visit: Payer: Self-pay

## 2022-09-29 ENCOUNTER — Inpatient Hospital Stay: Payer: BC Managed Care – PPO

## 2022-09-29 ENCOUNTER — Inpatient Hospital Stay: Payer: BC Managed Care – PPO | Admitting: Internal Medicine

## 2022-09-29 VITALS — BP 128/77 | HR 82 | Temp 97.1°F | Wt 253.0 lb

## 2022-09-29 DIAGNOSIS — D72820 Lymphocytosis (symptomatic): Secondary | ICD-10-CM | POA: Diagnosis present

## 2022-09-29 DIAGNOSIS — K219 Gastro-esophageal reflux disease without esophagitis: Secondary | ICD-10-CM | POA: Diagnosis not present

## 2022-09-29 DIAGNOSIS — Z6841 Body Mass Index (BMI) 40.0 and over, adult: Secondary | ICD-10-CM | POA: Diagnosis not present

## 2022-09-29 DIAGNOSIS — E669 Obesity, unspecified: Secondary | ICD-10-CM | POA: Insufficient documentation

## 2022-09-29 DIAGNOSIS — D72829 Elevated white blood cell count, unspecified: Secondary | ICD-10-CM | POA: Insufficient documentation

## 2022-09-29 LAB — CBC WITH DIFFERENTIAL/PLATELET
Abs Immature Granulocytes: 0.03 10*3/uL (ref 0.00–0.07)
Basophils Absolute: 0.1 10*3/uL (ref 0.0–0.1)
Basophils Relative: 1 %
Eosinophils Absolute: 0.2 10*3/uL (ref 0.0–0.5)
Eosinophils Relative: 3 %
HCT: 38.7 % (ref 36.0–46.0)
Hemoglobin: 12.7 g/dL (ref 12.0–15.0)
Immature Granulocytes: 0 %
Lymphocytes Relative: 32 %
Lymphs Abs: 3.1 10*3/uL (ref 0.7–4.0)
MCH: 28.2 pg (ref 26.0–34.0)
MCHC: 32.8 g/dL (ref 30.0–36.0)
MCV: 85.8 fL (ref 80.0–100.0)
Monocytes Absolute: 0.6 10*3/uL (ref 0.1–1.0)
Monocytes Relative: 7 %
Neutro Abs: 5.5 10*3/uL (ref 1.7–7.7)
Neutrophils Relative %: 57 %
Platelets: 146 10*3/uL — ABNORMAL LOW (ref 150–400)
RBC: 4.51 MIL/uL (ref 3.87–5.11)
RDW: 12.9 % (ref 11.5–15.5)
WBC: 9.6 10*3/uL (ref 4.0–10.5)
nRBC: 0 % (ref 0.0–0.2)

## 2022-09-29 LAB — C-REACTIVE PROTEIN: CRP: 2 mg/dL — ABNORMAL HIGH (ref <1.0)

## 2022-09-29 LAB — SEDIMENTATION RATE: Sed Rate: 29 mm/hr (ref 0–30)

## 2022-09-29 NOTE — Progress Notes (Signed)
[facility]  [phone] Fax:(336) [phone]  ID: Stephanie Howell OB: 02-Mar-1971  MR#: [id number]  ACZ#:[id number]  Patient Care Team: Jerl Mina, MD as PCP - General (Family Medicine)  REFERRING PROVIDER: Dr. Burnett Sheng  REASON FOR REFERRAL: Leukocytosis  HPI: Stephanie Howell is a 52 y.o. female with past medical history of fibroid, GERD, obesity was referred to hematology for workup of leukocytosis.  Overall, patient is feeling good.  Denies any shortness of breath, chest pain, weight loss, abdominal pain.  Has headaches associated with the menstrual cycle.  Denies any smoking.  Occasional alcohol use.  Labs reviewed.  Patient has intermittent elevation in WBC since 2019.  Ranges between 10-12,000.  Predominantly lymphocytosis ranging between 3.7-4.7.  Hemoglobin and platelet is normal.   REVIEW OF SYSTEMS:   ROS  As per HPI. Otherwise, a complete review of systems is negative.  PAST MEDICAL HISTORY: Past Medical History:  Diagnosis Date   Allergic state    Back pain    Fibroids    GERD (gastroesophageal reflux disease)    Obesity     PAST SURGICAL HISTORY: Past Surgical History:  Procedure Laterality Date   COLONOSCOPY WITH PROPOFOL N/A 07/14/2015   Procedure: COLONOSCOPY WITH PROPOFOL;  Surgeon: Christena Deem, MD;  Location: [facility] ENDOSCOPY;  Service: Endoscopy;  Laterality: N/A;   UPPER GI ENDOSCOPY      FAMILY HISTORY: Family History  Problem Relation Age of Onset   Breast cancer Neg Hx     HEALTH MAINTENANCE: Social History   Tobacco Use   Smoking status: Never  Substance Use Topics   Alcohol use: Yes   Drug use: No     No Known Allergies  Current Outpatient Medications  Medication Sig Dispense Refill   Biotin 1 MG CAPS Take 1 mg by mouth daily.     Cholecalciferol 1000 units TBDP Take 1,000 Units by mouth daily.     esomeprazole (NEXIUM) 40 MG capsule Take 40 mg by mouth daily at 12 noon.     meloxicam  (MOBIC) 15 MG tablet Take 15 mg by mouth daily.     Multiple Vitamin (MULTIVITAMIN) tablet Take 1 tablet by mouth daily.     Norgestimate-Eth Estradiol (SPRINTEC 28 PO) Take by mouth.     No current facility-administered medications for this visit.    OBJECTIVE: Vitals:   09/29/22 1444  BP: 128/77  Pulse: 82  Temp: (!) 97.1 F (36.2 C)  SpO2: 98%     Body mass index is 43.43 kg/m.      General: Well-developed, well-nourished, no acute distress. Eyes: Pink conjunctiva, anicteric sclera. HEENT: Normocephalic, moist mucous membranes, clear oropharnyx. Lungs: Clear to auscultation bilaterally. Heart: Regular rate and rhythm. No rubs, murmurs, or gallops. Abdomen: Soft, nontender, nondistended. No organomegaly noted, normoactive bowel sounds. Musculoskeletal: No edema, cyanosis, or clubbing. Neuro: Alert, answering all questions appropriately. Cranial nerves grossly intact. Skin: No rashes or petechiae noted. Psych: Normal affect. Lymphatics: No cervical, calvicular, axillary or inguinal LAD.   LAB RESULTS:  No results found for: "NA", "K", "CL", "CO2", "GLUCOSE", "BUN", "CREATININE", "CALCIUM", "PROT", "ALBUMIN", "AST", "ALT", "ALKPHOS", "BILITOT", "GFRNONAA", "GFRAA"  No results found for: "WBC", "NEUTROABS", "HGB", "HCT", "MCV", "PLT"  No results found for: "TIBC", "FERRITIN", "IRONPCTSAT"   STUDIES: No results found.  ASSESSMENT AND PLAN:   Stephanie Howell is a 52 y.o. female with pmh of fibroid, GERD, obesity was referred to hematology for workup of leukocytosis.  # Leukocytosis # Lymphocytosis -Chronic, of  unclear etiology. Labs reviewed.  Patient has intermittent elevation in WBC since 2019.  Ranges between 10-12,000.  Predominantly lymphocytosis ranging between 3.7-4.7.  Hemoglobin and platelet is normal.  -Labs as below. -I discussed with the patient considering that it is chronic and stable since 2019 and she is feeling well overall this is looking more like a  benign process.  Will obtain lab work to rule out any inflammation and flow cytometry to rule out any monoclonal process.   Orders Placed This Encounter  Procedures   CBC with Differential/Platelet   Sedimentation rate   C-reactive protein   Flow cytometry panel-leukemia/lymphoma work-up   RTC in 2 weeks via video visit to discuss labs.  Patient expressed understanding and was in agreement with this plan. She also understands that She can call clinic at any time with any questions, concerns, or complaints.   I spent a total of 45 minutes reviewing chart data, face-to-face evaluation with the patient, counseling and coordination of care as detailed above.  Michaelyn Barter, MD   09/29/2022 2:55 PM

## 2022-09-30 DIAGNOSIS — D72820 Lymphocytosis (symptomatic): Secondary | ICD-10-CM | POA: Insufficient documentation

## 2022-10-03 ENCOUNTER — Ambulatory Visit
Admission: RE | Admit: 2022-10-03 | Discharge: 2022-10-03 | Disposition: A | Discharge status: Home / Self Care | Payer: BC Managed Care – PPO | Source: Ambulatory Visit | Attending: Family Medicine | Admitting: Family Medicine

## 2022-10-03 DIAGNOSIS — R921 Mammographic calcification found on diagnostic imaging of breast: Secondary | ICD-10-CM | POA: Insufficient documentation

## 2022-10-13 ENCOUNTER — Inpatient Hospital Stay: Payer: BC Managed Care – PPO | Attending: Internal Medicine | Admitting: Internal Medicine

## 2022-10-13 ENCOUNTER — Telehealth: Payer: Self-pay

## 2022-10-13 DIAGNOSIS — D72829 Elevated white blood cell count, unspecified: Secondary | ICD-10-CM | POA: Insufficient documentation

## 2022-10-13 DIAGNOSIS — D72820 Lymphocytosis (symptomatic): Secondary | ICD-10-CM | POA: Diagnosis not present

## 2022-10-13 NOTE — Progress Notes (Signed)
[facility]  Telephone:([phone] [phone]  I connected with Stephanie Howell on 10/13/22 at  3:00 PM EDT by my chart video and verified that I am speaking with the correct person using two identifiers.   I discussed the limitations, risks, security and privacy concerns of performing an evaluation and management service by telemedicine and the availability of in-person appointments. I also discussed with the patient that there may be a patient responsible charge related to this service. The patient expressed understanding and agreed to proceed.   Other persons participating in the visit and their role in the encounter: none   Patient's location: home  Provider's location: clnic   Chief Complaint: discuss labs   ID: Stephanie Howell OB: 22-Feb-1971  MR#: [id number]  ION#:[id number]  Patient Care Team: Jerl Mina, MD as PCP - General (Family Medicine) Michaelyn Barter, MD as Consulting Physician (Oncology)  REFERRING PROVIDER: Dr. Burnett Sheng  REASON FOR REFERRAL: Leukocytosis  HPI: Stephanie Howell is a 52 y.o. female with past medical history of fibroid, GERD, obesity was referred to hematology for workup of leukocytosis.  Overall, patient is feeling good.  Denies any shortness of breath, chest pain, weight loss, abdominal pain.  Has headaches associated with the menstrual cycle.  Denies any smoking.  Occasional alcohol use.  Labs reviewed.  Patient has intermittent elevation in WBC since 2019.  Ranges between 10-12,000.  Predominantly lymphocytosis ranging between 3.7-4.7.  Hemoglobin and platelet is normal.  Interval history Connected with the patient via MyChart video visit. She has been feeling well.  Denies any concerns.  REVIEW OF SYSTEMS:   ROS  As per HPI. Otherwise, a complete review of systems is negative.  PAST MEDICAL HISTORY: Past Medical History:  Diagnosis Date   Allergic state    Back pain    Fibroids    GERD  (gastroesophageal reflux disease)    Obesity     PAST SURGICAL HISTORY: Past Surgical History:  Procedure Laterality Date   COLONOSCOPY WITH PROPOFOL N/A 07/14/2015   Procedure: COLONOSCOPY WITH PROPOFOL;  Surgeon: Christena Deem, MD;  Location: [facility] ENDOSCOPY;  Service: Endoscopy;  Laterality: N/A;   UPPER GI ENDOSCOPY      FAMILY HISTORY: Family History  Problem Relation Age of Onset   Breast cancer Neg Hx     HEALTH MAINTENANCE: Social History   Tobacco Use   Smoking status: Never  Substance Use Topics   Alcohol use: Yes   Drug use: No     No Known Allergies  Current Outpatient Medications  Medication Sig Dispense Refill   Biotin 1 MG CAPS Take 1 mg by mouth daily.     Cholecalciferol 1000 units TBDP Take 1,000 Units by mouth daily.     esomeprazole (NEXIUM) 40 MG capsule Take 40 mg by mouth daily at 12 noon.     meloxicam (MOBIC) 15 MG tablet Take 15 mg by mouth daily.     Multiple Vitamin (MULTIVITAMIN) tablet Take 1 tablet by mouth daily.     Norgestimate-Eth Estradiol (SPRINTEC 28 PO) Take by mouth.     No current facility-administered medications for this visit.    OBJECTIVE: There were no vitals filed for this visit.    There is no height or weight on file to calculate BMI.      Physical exam not performed   LAB RESULTS:  No results found for: "NA", "K", "CL", "CO2", "GLUCOSE", "BUN", "CREATININE", "CALCIUM", "PROT", "ALBUMIN", "AST", "ALT", "ALKPHOS", "BILITOT", "GFRNONAA", "GFRAA"  Lab Results  Component Value Date   WBC 9.6 09/29/2022   NEUTROABS 5.5 09/29/2022   HGB 12.7 09/29/2022   HCT 38.7 09/29/2022   MCV 85.8 09/29/2022   PLT 146 (L) 09/29/2022    No results found for: "TIBC", "FERRITIN", "IRONPCTSAT"   STUDIES: MM 3D DIAGNOSTIC MAMMOGRAM UNILATERAL LEFT BREAST  Result Date: 10/03/2022 CLINICAL DATA:  Short-term interval follow-up of probable benign calcifications in the left breast originally seen on screening mammogram dated  03/28/2022. EXAM: DIGITAL DIAGNOSTIC UNILATERAL LEFT MAMMOGRAM WITH TOMOSYNTHESIS AND CAD TECHNIQUE: Left digital diagnostic mammography and breast tomosynthesis was performed. The images were evaluated with computer-aided detection. COMPARISON:  Previous exam(s). ACR Breast Density Category c: The breasts are heterogeneously dense, which may obscure small masses. FINDINGS: Loosely grouped calcifications with a differential appearance on the lateral view in the upper-inner quadrant of the left breast are stable. No suspicious mass identified. IMPRESSION: Stable benign appearing calcifications in the upper inner quadrant of the left breast. RECOMMENDATION: Bilateral diagnostic mammogram in January of 2025 is recommended to document stability of calcifications in the left breast for 1 year. I have discussed the findings and recommendations with the patient. If applicable, a reminder letter will be sent to the patient regarding the next appointment. BI-RADS CATEGORY  3: Probably benign. Electronically Signed   By: Baird Lyons M.D.   On: 10/03/2022 10:06    ASSESSMENT AND PLAN:   Stephanie Howell is a 52 y.o. female with pmh of fibroid, GERD, obesity was referred to hematology for workup of leukocytosis.  # Leukocytosis #Lymphocytosis -Chronic, of unclear etiology. Labs reviewed.  Patient has intermittent elevation in WBC since 2019.  Ranges between 10-12,000.  Predominantly lymphocytosis ranging between 3.7-4.7.  Hemoglobin and platelet is normal.  -On repeat CBC, WBC 9.6, ALC 3.2 which is normal.  Flow cytometry was negative.  ESR normal.  CRP mildly elevated to 2.0.  I discussed with the patient that there could be mild increase in inflammatory marker from obesity which in turn can cause reactive elevation in WBC count. Considering that it is chronic and stable since 2019 and she is feeling well overall I would hold off on further workup.  She will continue to follow-up with her PCP Dr. Burnett Sheng for annual  CBC with differential.  She will follow-up with me as needed.   No orders of the defined types were placed in this encounter.  RTC as needed  Patient expressed understanding and was in agreement with this plan. She also understands that She can call clinic at any time with any questions, concerns, or complaints.   I spent a total of 25 minutes reviewing chart data, face-to-face evaluation with the patient, counseling and coordination of care as detailed above.  Michaelyn Barter, MD   10/13/2022 2:55 PM

## 2022-10-13 NOTE — Telephone Encounter (Signed)
Called patient in regards to her MyChart Visit with Dr. Alena Bills this afternoon at 3:00 pm. I called both patients home and mobile phone and no answer and couldn't leave a message

## 2022-11-16 ENCOUNTER — Ambulatory Visit
Admission: RE | Admit: 2022-11-16 | Discharge: 2022-11-16 | Disposition: A | Discharge status: Home / Self Care | Payer: BC Managed Care – PPO | Source: Ambulatory Visit

## 2022-11-16 VITALS — BP 116/75 | HR 94 | Temp 99.0°F | Resp 18

## 2022-11-16 DIAGNOSIS — J069 Acute upper respiratory infection, unspecified: Secondary | ICD-10-CM | POA: Diagnosis not present

## 2022-11-16 MED ORDER — BENZONATATE 100 MG PO CAPS
100.0000 mg | ORAL_CAPSULE | Freq: Three times a day (TID) | ORAL | 0 refills | Status: AC | PRN
Start: 2022-11-16 — End: ?

## 2022-11-16 NOTE — ED Provider Notes (Signed)
Renaldo Fiddler    CSN: [id number] Arrival date & time: 11/16/22  1547      History   Chief Complaint Chief Complaint  Patient presents with   Cough    I am congested and coughing up phlegm. - Entered by patient    HPI Stephanie Howell is a 52 y.o. female.  Patient presents with 2 to 3-day history of congestion and cough.  Negative COVID test at home.  She has been treating her symptoms with Coricidin.  No fever, shortness of breath, chest pain, or other symptoms.  Her medical history includes GERD and obesity.  The history is provided by the patient and medical records.    Past Medical History:  Diagnosis Date   Allergic state    Back pain    Fibroids    GERD (gastroesophageal reflux disease)    Obesity     Patient Active Problem List   Diagnosis Date Noted   Leucocytosis 10/13/2022    Past Surgical History:  Procedure Laterality Date   COLONOSCOPY WITH PROPOFOL N/A 07/14/2015   Procedure: COLONOSCOPY WITH PROPOFOL;  Surgeon: Christena Deem, MD;  Location: [facility] ENDOSCOPY;  Service: Endoscopy;  Laterality: N/A;   UPPER GI ENDOSCOPY      OB History   No obstetric history on file.      Home Medications    Prior to Admission medications   Medication Sig Start Date End Date Taking? Authorizing Provider  benzonatate (TESSALON) 100 MG capsule Take 1 capsule (100 mg total) by mouth 3 (three) times daily as needed for cough. 11/16/22  Yes Mickie Bail, NP  estradiol (ESTRACE) 1 MG tablet Take by mouth. 10/26/22 10/26/23 Yes [provider]  montelukast (SINGULAIR) 10 MG tablet Take by mouth. 03/26/19  Yes [provider]  progesterone (PROMETRIUM) 100 MG capsule Take by mouth. 10/26/22 10/26/23 Yes [provider]  Biotin 1 MG CAPS Take 1 mg by mouth daily.    [provider]  Cholecalciferol 1000 units TBDP Take 1,000 Units by mouth daily.    [provider]  citalopram (CELEXA) 20 MG tablet Take 20 mg by mouth daily.     [provider]  esomeprazole (NEXIUM) 40 MG capsule Take 40 mg by mouth daily at 12 noon.    [provider]  famotidine (PEPCID) 40 MG tablet Take 40 mg by mouth 2 (two) times daily. Patient not taking: Reported on 11/16/2022    [provider]  fluticasone (FLONASE) 50 MCG/ACT nasal spray Place 2 sprays into both nostrils daily.    [provider]  meloxicam (MOBIC) 15 MG tablet Take 15 mg by mouth daily.    [provider]  Multiple Vitamin (MULTIVITAMIN) tablet Take 1 tablet by mouth daily.    [provider]  Norgestimate-Eth Estradiol (SPRINTEC 28 PO) Take by mouth. Patient not taking: Reported on 11/16/2022    [provider]  TRI-SPRINTEC 0.18/0.215/0.25 MG-35 MCG tablet Take 1 tablet by mouth daily. Patient not taking: Reported on 11/16/2022    [provider]  triamterene-hydrochlorothiazide (DYAZIDE) 37.5-25 MG capsule Take 1 capsule by mouth every morning.    [provider]    Family History Family History  Problem Relation Age of Onset   Breast cancer Neg Hx     Social History Social History   Tobacco Use   Smoking status: Never  Substance Use Topics   Alcohol use: Yes   Drug use: No     Allergies  Patient has no known allergies.   Review of Systems Review of Systems  Constitutional:  Negative for chills and fever.  HENT:  Positive for congestion. Negative for ear pain and sore throat.   Respiratory:  Positive for cough. Negative for shortness of breath.   Cardiovascular:  Negative for chest pain and palpitations.  Gastrointestinal:  Negative for diarrhea and vomiting.     Physical Exam Triage Vital Signs ED Triage Vitals  Encounter Vitals Group     BP --      Systolic BP Percentile --      Diastolic BP Percentile --      Pulse Rate 11/16/22 1606 94     Resp 11/16/22 1606 18     Temp 11/16/22 1606 99 F (37.2 C)     Temp src --      SpO2 11/16/22 1606 96 %      Weight --      Height --      Head Circumference --      Peak Flow --      Pain Score 11/16/22 1621 0     Pain Loc --      Pain Education --      Exclude from Growth Chart --    No data found.  Updated Vital Signs BP 116/75   Pulse 94   Temp 99 F (37.2 C)   Resp 18   SpO2 96%   Visual Acuity Right Eye Distance:   Left Eye Distance:   Bilateral Distance:    Right Eye Near:   Left Eye Near:    Bilateral Near:     Physical Exam Vitals and nursing note reviewed.  Constitutional:      General: She is not in acute distress.    Appearance: She is well-developed.  HENT:     Right Ear: Tympanic membrane normal.     Left Ear: Tympanic membrane normal.     Nose: Nose normal.     Mouth/Throat:     Mouth: Mucous membranes are moist.     Pharynx: Oropharynx is clear.  Cardiovascular:     Rate and Rhythm: Normal rate and regular rhythm.     Heart sounds: Normal heart sounds.  Pulmonary:     Effort: Pulmonary effort is normal. No respiratory distress.     Breath sounds: Normal breath sounds.  Musculoskeletal:     Cervical back: Neck supple.  Skin:    General: Skin is warm and dry.  Neurological:     Mental Status: She is alert.  Psychiatric:        Mood and Affect: Mood normal.        Behavior: Behavior normal.      UC Treatments / Results  Labs (all labs ordered are listed, but only abnormal results are displayed) Labs Reviewed - No data to display  EKG   Radiology No results found.  Procedures Procedures (including critical care time)  Medications Ordered in UC Medications - No data to display  Initial Impression / Assessment and Plan / UC Course  I have reviewed the triage vital signs and the nursing notes.  Pertinent labs & imaging results that were available during my care of the patient were reviewed by me and considered in my medical decision making (see chart for details).    Viral URI.  Afebrile and vital signs are stable.  Lungs are clear.   Treating cough with Tessalon Perles.  Work note provided per patient request.  Education  provided on viral URI.  Instructed patient to follow up with her PCP if her symptoms are not improving.  She agrees to plan of care.    Final Clinical Impressions(s) / UC Diagnoses   Final diagnoses:  Viral URI     Discharge Instructions      Take the Tessalon Perles as directed.  Follow up with your primary care provider if your symptoms are not improving.        ED Prescriptions     Medication Sig Dispense Auth. Provider   benzonatate (TESSALON) 100 MG capsule Take 1 capsule (100 mg total) by mouth 3 (three) times daily as needed for cough. 21 capsule Mickie Bail, NP      PDMP not reviewed this encounter.   Mickie Bail, NP 11/16/22 1642

## 2022-11-16 NOTE — ED Triage Notes (Signed)
Patient to Urgent Care with complaints of productive cough/ nasal and chest congestion. Denies any known fevers.  Reports symptoms started three days ago. Negative home Covid test on Monday.   Taking Corcidin. Requests benzonatate.

## 2022-11-16 NOTE — Discharge Instructions (Addendum)
Take the [facility] as directed.  Follow up with your primary care provider if your symptoms are not improving.

## 2023-01-16 ENCOUNTER — Ambulatory Visit: Payer: BC Managed Care – PPO

## 2023-01-16 DIAGNOSIS — Z83719 Family history of colon polyps, unspecified: Secondary | ICD-10-CM | POA: Diagnosis not present

## 2023-01-16 DIAGNOSIS — K621 Rectal polyp: Secondary | ICD-10-CM | POA: Diagnosis not present

## 2023-01-16 DIAGNOSIS — K635 Polyp of colon: Secondary | ICD-10-CM | POA: Diagnosis not present

## 2023-01-16 DIAGNOSIS — Z1211 Encounter for screening for malignant neoplasm of colon: Secondary | ICD-10-CM | POA: Diagnosis present

## 2023-01-16 DIAGNOSIS — K641 Second degree hemorrhoids: Secondary | ICD-10-CM | POA: Diagnosis not present

## 2023-03-10 ENCOUNTER — Other Ambulatory Visit: Payer: Self-pay | Admitting: Family Medicine

## 2023-03-10 DIAGNOSIS — R921 Mammographic calcification found on diagnostic imaging of breast: Secondary | ICD-10-CM

## 2023-04-07 ENCOUNTER — Ambulatory Visit
Admission: RE | Admit: 2023-04-07 | Discharge: 2023-04-07 | Disposition: A | Discharge status: Home / Self Care | Payer: 59 | Source: Ambulatory Visit | Attending: Family Medicine | Admitting: Family Medicine

## 2023-04-07 DIAGNOSIS — R921 Mammographic calcification found on diagnostic imaging of breast: Secondary | ICD-10-CM | POA: Insufficient documentation

## 2023-08-16 ENCOUNTER — Ambulatory Visit: Admission: EM | Admit: 2023-08-16 | Discharge: 2023-08-16

## 2023-08-16 ENCOUNTER — Other Ambulatory Visit: Payer: Self-pay

## 2023-08-16 ENCOUNTER — Encounter: Payer: Self-pay | Admitting: *Deleted

## 2023-08-16 ENCOUNTER — Emergency Department

## 2023-08-16 ENCOUNTER — Emergency Department
Admission: EM | Admit: 2023-08-16 | Discharge: 2023-08-17 | Disposition: A | Discharge status: Home / Self Care | Attending: Emergency Medicine | Admitting: Emergency Medicine

## 2023-08-16 DIAGNOSIS — R0789 Other chest pain: Secondary | ICD-10-CM | POA: Diagnosis present

## 2023-08-16 DIAGNOSIS — R079 Chest pain, unspecified: Secondary | ICD-10-CM | POA: Diagnosis not present

## 2023-08-16 LAB — CBC
HCT: 40.1 % (ref 36.0–46.0)
Hemoglobin: 13.1 g/dL (ref 12.0–15.0)
MCH: 28.5 pg (ref 26.0–34.0)
MCHC: 32.7 g/dL (ref 30.0–36.0)
MCV: 87.4 fL (ref 80.0–100.0)
Platelets: 283 10*3/uL (ref 150–400)
RBC: 4.59 MIL/uL (ref 3.87–5.11)
RDW: 13.2 % (ref 11.5–15.5)
WBC: 12.4 10*3/uL — ABNORMAL HIGH (ref 4.0–10.5)
nRBC: 0 % (ref 0.0–0.2)

## 2023-08-16 LAB — BASIC METABOLIC PANEL WITH GFR
Anion gap: 11 (ref 5–15)
BUN: 16 mg/dL (ref 6–20)
CO2: 25 mmol/L (ref 22–32)
Calcium: 9.3 mg/dL (ref 8.9–10.3)
Chloride: 103 mmol/L (ref 98–111)
Creatinine, Ser: 0.67 mg/dL (ref 0.44–1.00)
GFR, Estimated: >60 mL/min (ref >60)
Glucose, Bld: 112 mg/dL — ABNORMAL HIGH (ref 70–99)
Potassium: 3.4 mmol/L — ABNORMAL LOW (ref 3.5–5.1)
Sodium: 139 mmol/L (ref 135–145)

## 2023-08-16 LAB — POC URINE PREG, ED: Preg Test, Ur: NEGATIVE

## 2023-08-16 LAB — TROPONIN I (HIGH SENSITIVITY): Troponin I (High Sensitivity): 2 ng/L (ref <18)

## 2023-08-16 NOTE — ED Triage Notes (Signed)
 Patient states she started having chest pain 6/2 which she thought was gas pains and simethicone helps but it reoccurs and now she has pain down left arm, tingling in fingers and 10/10 pain left axilla.

## 2023-08-16 NOTE — ED Triage Notes (Signed)
 Pt ambulatory to triage.  Pt sent from urgent care for eval of left side chest pain.    No sob.  Sx for 1.5 weeks.  Pt reports passing a lot of gas also.  Pain also in left arm with throbbing pain.  Pt alert  speech clear.

## 2023-08-16 NOTE — ED Provider Notes (Signed)
 [facility] Provider Note    Event Date/Time   First MD Initiated Contact with Patient 08/16/23 2348     (approximate)   History   Chest Pain   HPI  Stephanie Howell is a 53 y.o. female who presents to the ED for evaluation of Chest Pain   I review a PCP visit from March.  Obese patient with history of GERD, anxiety  Patient presents for evaluation of 2 weeks of intermittent left-sided chest pain.  Pain is worse with twisting her body, moving her arm, touching the area, heavy lifting.   No cough, shortness of breath, trauma or injuries, nausea or emesis.  No postprandial symptoms.   Physical Exam   Triage Vital Signs: ED Triage Vitals  Encounter Vitals Group     BP 08/16/23 1943 (!) 139/100     Systolic BP Percentile --      Diastolic BP Percentile --      Pulse Rate 08/16/23 1943 92     Resp 08/16/23 1943 18     Temp 08/16/23 1943 97.7 F (36.5 C)     Temp Source 08/16/23 1943 Oral     SpO2 08/16/23 1943 98 %     Weight 08/16/23 1946 240 lb (108.9 kg)     Height 08/16/23 1946 5' 5 (1.651 m)     Head Circumference --      Peak Flow --      Pain Score 08/16/23 1944 8     Pain Loc --      Pain Education --      Exclude from Growth Chart --     Most recent vital signs: Vitals:   08/16/23 1943 08/17/23 0159  BP: (!) 139/100 (!) 132/95  Pulse: 92 74  Resp: 18 13  Temp: 97.7 F (36.5 C) 97.7 F (36.5 C)  SpO2: 98% 98%    General: Awake, no distress.  CV:  Good peripheral perfusion.  Resp:  Normal effort.  Abd:  No distention.  MSK:  No deformity noted.  Reproducible chest pain on palpation without overlying skin changes or signs of trauma Neuro:  No focal deficits appreciated. Other:     ED Results / Procedures / Treatments   Labs (all labs ordered are listed, but only abnormal results are displayed) Labs Reviewed  BASIC METABOLIC PANEL WITH GFR - Abnormal; Notable for the following components:      Result Value    Potassium 3.4 (*)    Glucose, Bld 112 (*)    All other components within normal limits  CBC - Abnormal; Notable for the following components:   WBC 12.4 (*)    All other components within normal limits  HEPATIC FUNCTION PANEL  LIPASE, BLOOD  POC URINE PREG, ED  TROPONIN I (HIGH SENSITIVITY)  TROPONIN I (HIGH SENSITIVITY)    EKG Sinus rhythm with a rate of 91 bpm.  Normal axis and intervals.  Nonspecific ST changes to inferior leads.  No comparison.  No STEMI.  RADIOLOGY CXR interpreted by me without evidence of acute cardiopulmonary pathology.  Official radiology report(s): DG Chest 2 View Result Date: 08/16/2023 CLINICAL DATA:  Chest pain EXAM: CHEST - 2 VIEW COMPARISON:  None Available. FINDINGS: No consolidation, pneumothorax or effusion. No edema. Normal cardiopericardial silhouette. Tortuous ectatic aorta. Overlapping gown snaps. IMPRESSION: No acute cardiopulmonary disease. Electronically Signed   By: Adrianna Horde M.D.   On: 08/16/2023 20:13    PROCEDURES and INTERVENTIONS:  .1-3 Lead EKG Interpretation  Performed by: Arline Bennett, MD Authorized by: Arline Bennett, MD     Interpretation: normal     ECG rate:  88   ECG rate assessment: normal     Rhythm: sinus rhythm     Ectopy: none     Conduction: normal     Medications  ketorolac (TORADOL) 30 MG/ML injection 15 mg (15 mg Intravenous Given 08/17/23 0056)  alum & mag hydroxide-simeth (MAALOX/MYLANTA) 200-200-20 MG/5ML suspension 30 mL (30 mLs Oral Given 08/17/23 0227)  lidocaine  (XYLOCAINE ) 2 % viscous mouth solution 15 mL (15 mLs Mouth/Throat Given 08/17/23 0227)     IMPRESSION / MDM / ASSESSMENT AND PLAN / ED COURSE  I reviewed the triage vital signs and the nursing notes.  Differential diagnosis includes, but is not limited to, ACS, PTX, PNA, muscle strain/spasm, PE, dissection, anxiety, pleural effusion  {Patient presents with symptoms of an acute illness or injury that is potentially  life-threatening.  Patient presents with atypical chest discomfort.  EKG is nonischemic, but does have nonspecific changes but uncertain chronicity.  First troponin is negative and we will trend this for a second.  High suspicion for MSK pain so we will treat this with Toradol as we keep her on the monitor, trend troponins and reassess.  Marginal leukocytosis but I doubt infectious etiology for symptoms.  No significant metabolic derangements.  Troponins remain low.  I considered admission for this patient but she is eager to go home I think this would be reasonable.  Refer to cardiology and discussed return precautions.  Clinical Course as of 08/17/23 0252  Thu Aug 17, 2023  0224 Reassessed and discussed reassuring workup [DS]    Clinical Course User Index [DS] Arline Bennett, MD     FINAL CLINICAL IMPRESSION(S) / ED DIAGNOSES   Final diagnoses:  Other chest pain     Rx / DC Orders   ED Discharge Orders          Ordered    Ambulatory referral to Cardiology       Comments: If you have not heard from the Cardiology office within the next 72 hours please call [phone].   08/17/23 0252             Note:  This document was prepared using Dragon voice recognition software and may include unintentional dictation errors.   Arline Bennett, MD 08/17/23 [phone]

## 2023-08-17 LAB — HEPATIC FUNCTION PANEL
ALT: 33 U/L (ref 0–44)
AST: 29 U/L (ref 15–41)
Albumin: 3.9 g/dL (ref 3.5–5.0)
Alkaline Phosphatase: 63 U/L (ref 38–126)
Bilirubin, Direct: <0.1 mg/dL (ref 0.0–0.2)
Total Bilirubin: 0.7 mg/dL (ref 0.0–1.2)
Total Protein: 7.3 g/dL (ref 6.5–8.1)

## 2023-08-17 LAB — TROPONIN I (HIGH SENSITIVITY): Troponin I (High Sensitivity): <2 ng/L (ref <18)

## 2023-08-17 LAB — LIPASE, BLOOD: Lipase: 36 U/L (ref 11–51)

## 2023-08-17 MED ORDER — ALUM & MAG HYDROXIDE-SIMETH 200-200-20 MG/5ML PO SUSP
30.0000 mL | Freq: Once | ORAL | Status: AC
  Administered 2023-08-17: 30 mL via ORAL
  Filled 2023-08-17: qty 30

## 2023-08-17 MED ORDER — KETOROLAC TROMETHAMINE 30 MG/ML IJ SOLN
15.0000 mg | Freq: Once | INTRAMUSCULAR | Status: AC
  Administered 2023-08-17: 15 mg via INTRAVENOUS
  Filled 2023-08-17: qty 1

## 2023-08-17 MED ORDER — LIDOCAINE VISCOUS HCL 2 % MT SOLN
15.0000 mL | Freq: Once | OROMUCOSAL | Status: AC
  Administered 2023-08-17: 15 mL via OROMUCOSAL
  Filled 2023-08-17: qty 15

## 2023-08-17 NOTE — Discharge Instructions (Addendum)
 Please take Tylenol and ibuprofen/Advil for your pain.  It is safe to take them together, or to alternate them every few hours.  Take up to 1000mg  of Tylenol at a time, up to 4 times per day.  Do not take more than 4000 mg of Tylenol in 24 hours.  For ibuprofen, take 400-600 mg, 3 - 4 times per day.  If your symptoms worsen or you have other concerns then please return to the ED

## 2023-08-21 ENCOUNTER — Other Ambulatory Visit: Payer: Self-pay | Admitting: Nurse Practitioner

## 2023-08-21 DIAGNOSIS — R079 Chest pain, unspecified: Secondary | ICD-10-CM

## 2023-08-21 DIAGNOSIS — I10 Essential (primary) hypertension: Secondary | ICD-10-CM

## 2023-08-21 DIAGNOSIS — E66813 Obesity, class 3: Secondary | ICD-10-CM

## 2023-08-21 DIAGNOSIS — Z8249 Family history of ischemic heart disease and other diseases of the circulatory system: Secondary | ICD-10-CM

## 2023-09-05 ENCOUNTER — Other Ambulatory Visit

## 2023-09-05 ENCOUNTER — Ambulatory Visit
Admission: RE | Admit: 2023-09-05 | Discharge: 2023-09-05 | Disposition: A | Discharge status: Home / Self Care | Payer: Self-pay | Source: Ambulatory Visit | Attending: Nurse Practitioner | Admitting: Nurse Practitioner

## 2023-09-05 DIAGNOSIS — R079 Chest pain, unspecified: Secondary | ICD-10-CM | POA: Insufficient documentation

## 2023-09-05 DIAGNOSIS — Z8249 Family history of ischemic heart disease and other diseases of the circulatory system: Secondary | ICD-10-CM | POA: Insufficient documentation

## 2023-09-05 DIAGNOSIS — E66813 Obesity, class 3: Secondary | ICD-10-CM | POA: Insufficient documentation

## 2023-09-05 DIAGNOSIS — I10 Essential (primary) hypertension: Secondary | ICD-10-CM | POA: Insufficient documentation

## 2023-09-13 ENCOUNTER — Other Ambulatory Visit: Payer: Self-pay | Admitting: Nurse Practitioner

## 2023-09-13 DIAGNOSIS — E66813 Obesity, class 3: Secondary | ICD-10-CM

## 2023-09-13 DIAGNOSIS — R9439 Abnormal result of other cardiovascular function study: Secondary | ICD-10-CM

## 2023-09-13 DIAGNOSIS — R079 Chest pain, unspecified: Secondary | ICD-10-CM

## 2023-09-13 DIAGNOSIS — I10 Essential (primary) hypertension: Secondary | ICD-10-CM

## 2023-09-28 ENCOUNTER — Ambulatory Visit

## 2023-10-04 ENCOUNTER — Telehealth (HOSPITAL_COMMUNITY): Payer: Self-pay | Admitting: Emergency Medicine

## 2023-10-04 NOTE — Telephone Encounter (Signed)
 Reaching out to patient to offer assistance regarding upcoming cardiac imaging study; pt verbalizes understanding of appt date/time, parking situation and where to check in, pre-test NPO status and medications ordered, and verified current allergies; name and call back number provided for further questions should they arise Rockwell Alexandria RN Navigator Cardiac Imaging [facility] [phone] office [phone] cell

## 2023-10-05 ENCOUNTER — Ambulatory Visit
Admission: RE | Admit: 2023-10-05 | Discharge: 2023-10-05 | Disposition: A | Discharge status: Home / Self Care | Source: Ambulatory Visit | Attending: Nurse Practitioner | Admitting: Nurse Practitioner

## 2023-10-05 DIAGNOSIS — E66813 Obesity, class 3: Secondary | ICD-10-CM | POA: Insufficient documentation

## 2023-10-05 DIAGNOSIS — I10 Essential (primary) hypertension: Secondary | ICD-10-CM | POA: Insufficient documentation

## 2023-10-05 DIAGNOSIS — R9439 Abnormal result of other cardiovascular function study: Secondary | ICD-10-CM | POA: Diagnosis present

## 2023-10-05 DIAGNOSIS — R079 Chest pain, unspecified: Secondary | ICD-10-CM | POA: Diagnosis present

## 2023-10-05 MED ORDER — NITROGLYCERIN 0.4 MG SL SUBL
0.8000 mg | SUBLINGUAL_TABLET | Freq: Once | SUBLINGUAL | Status: AC
  Administered 2023-10-05: 0.8 mg via SUBLINGUAL

## 2023-10-05 MED ORDER — METOPROLOL TARTRATE 5 MG/5ML IV SOLN
INTRAVENOUS | Status: AC
  Filled 2023-10-05: qty 10

## 2023-10-05 MED ORDER — IOHEXOL 350 MG/ML SOLN
100.0000 mL | Freq: Once | INTRAVENOUS | Status: AC | PRN
  Administered 2023-10-05: 100 mL via INTRAVENOUS

## 2023-10-05 MED ORDER — DILTIAZEM HCL 25 MG/5ML IV SOLN: 10.0000 mg | INTRAVENOUS | Status: DC | PRN

## 2023-10-05 MED ORDER — NITROGLYCERIN 0.4 MG SL SUBL
SUBLINGUAL_TABLET | SUBLINGUAL | Status: AC
  Filled 2023-10-05: qty 1

## 2023-10-05 MED ORDER — METOPROLOL TARTRATE 5 MG/5ML IV SOLN: 10.0000 mg | INTRAVENOUS | Status: DC | PRN

## 2023-10-19 ENCOUNTER — Other Ambulatory Visit: Payer: Self-pay | Admitting: Rheumatology

## 2023-10-19 DIAGNOSIS — M549 Dorsalgia, unspecified: Secondary | ICD-10-CM

## 2023-10-19 DIAGNOSIS — M461 Sacroiliitis, not elsewhere classified: Secondary | ICD-10-CM

## 2023-12-04 ENCOUNTER — Encounter: Payer: Self-pay | Admitting: Rheumatology

## 2023-12-05 ENCOUNTER — Inpatient Hospital Stay: Admission: RE | Admit: 2023-12-05 | Discharge: 2023-12-05 | Attending: Rheumatology | Admitting: Rheumatology

## 2023-12-05 DIAGNOSIS — M549 Dorsalgia, unspecified: Secondary | ICD-10-CM

## 2023-12-05 DIAGNOSIS — M461 Sacroiliitis, not elsewhere classified: Secondary | ICD-10-CM

## 2024-04-12 ENCOUNTER — Other Ambulatory Visit: Payer: Self-pay | Admitting: Family Medicine

## 2024-04-12 DIAGNOSIS — R921 Mammographic calcification found on diagnostic imaging of breast: Secondary | ICD-10-CM
